# Patient Record
Sex: Male | Born: 1995 | Race: Black or African American | Hispanic: No | Marital: Single | Smoking: Never smoker
Health system: Southern US, Community
[De-identification: ages and names within clinical notes are randomized; demographics above are authoritative.]

## PROBLEM LIST (undated history)

## (undated) HISTORY — PX: ANKLE SURGERY: SHX546

---
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
## 2018-09-10 IMAGING — CR DG CHEST 2V
2 series · 2 of 2 positions shown · non-contrast
Comparison: None.

CLINICAL DATA: Shortness of breath

EXAM:
CHEST  2 VIEW

[chest pa]
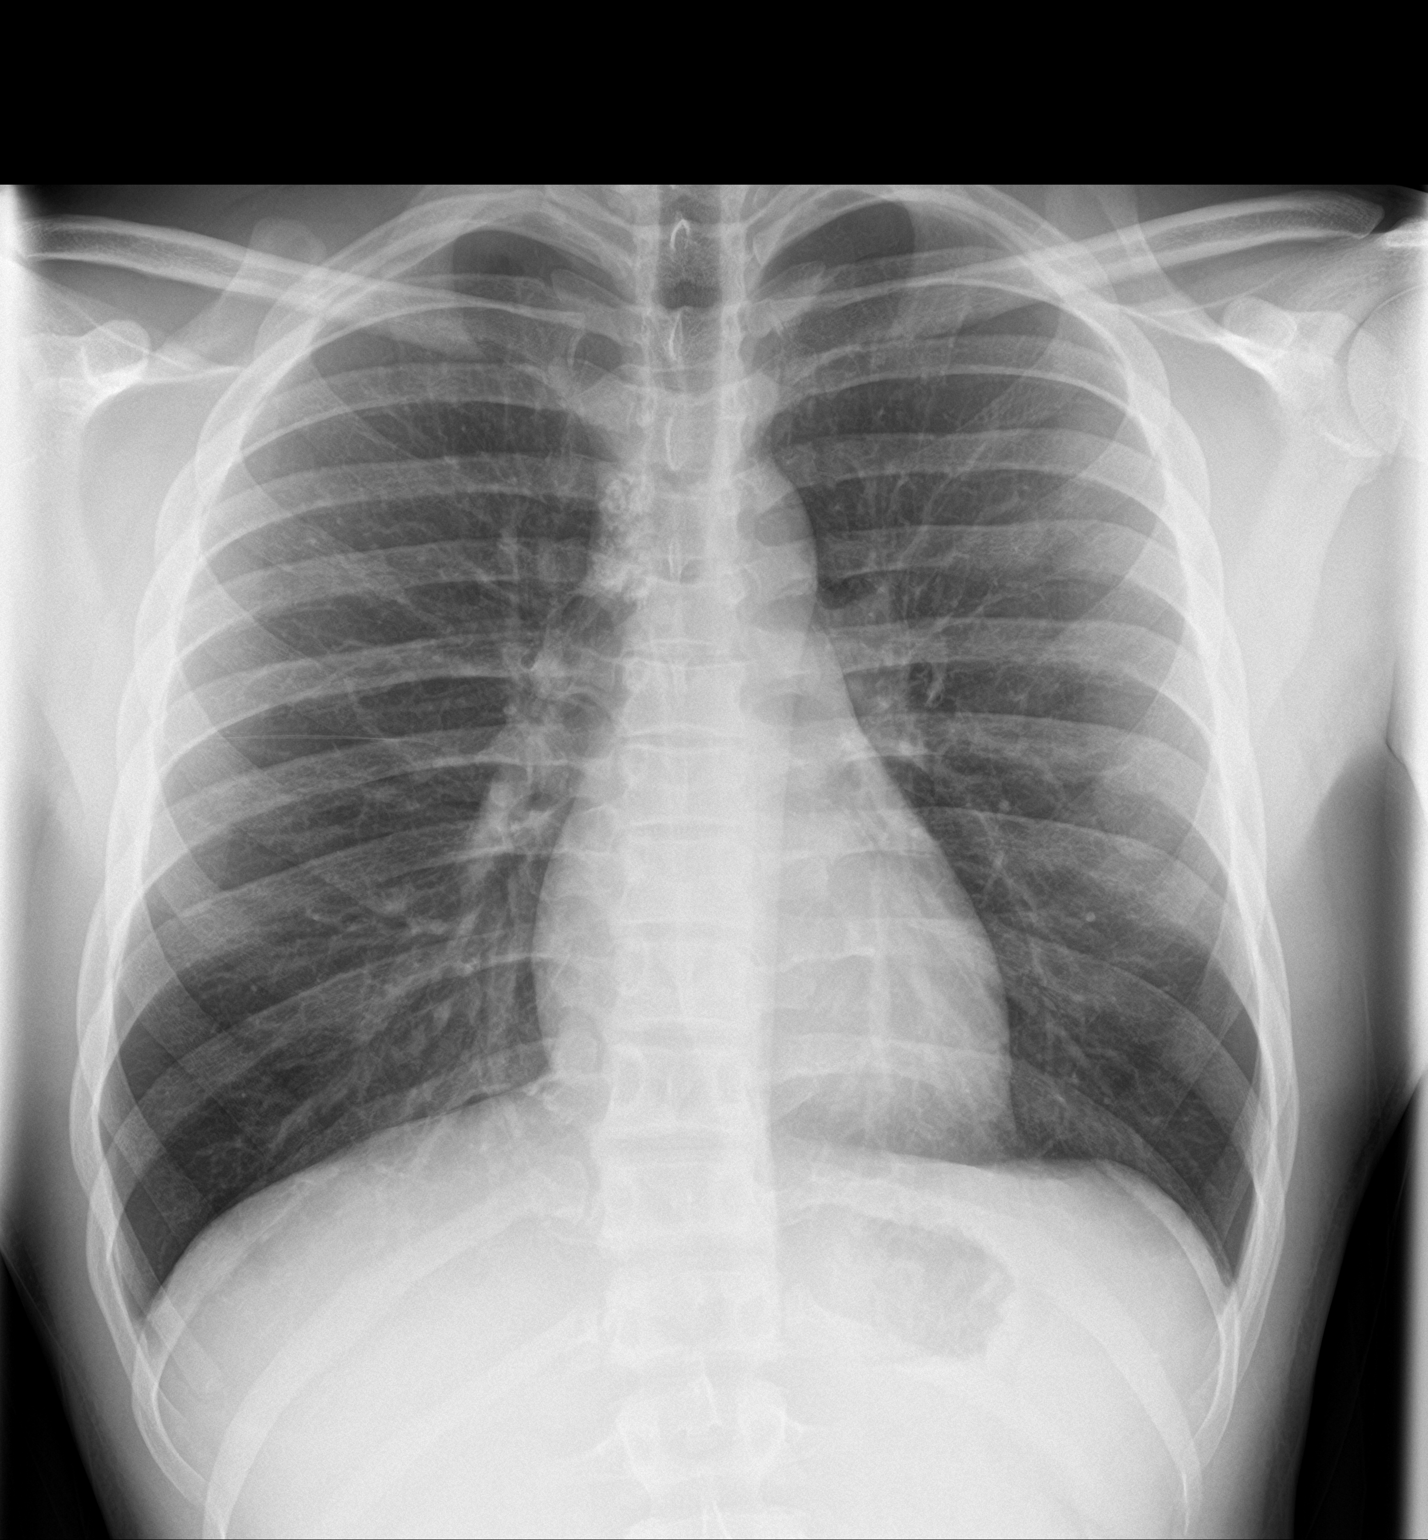

[chest lat]
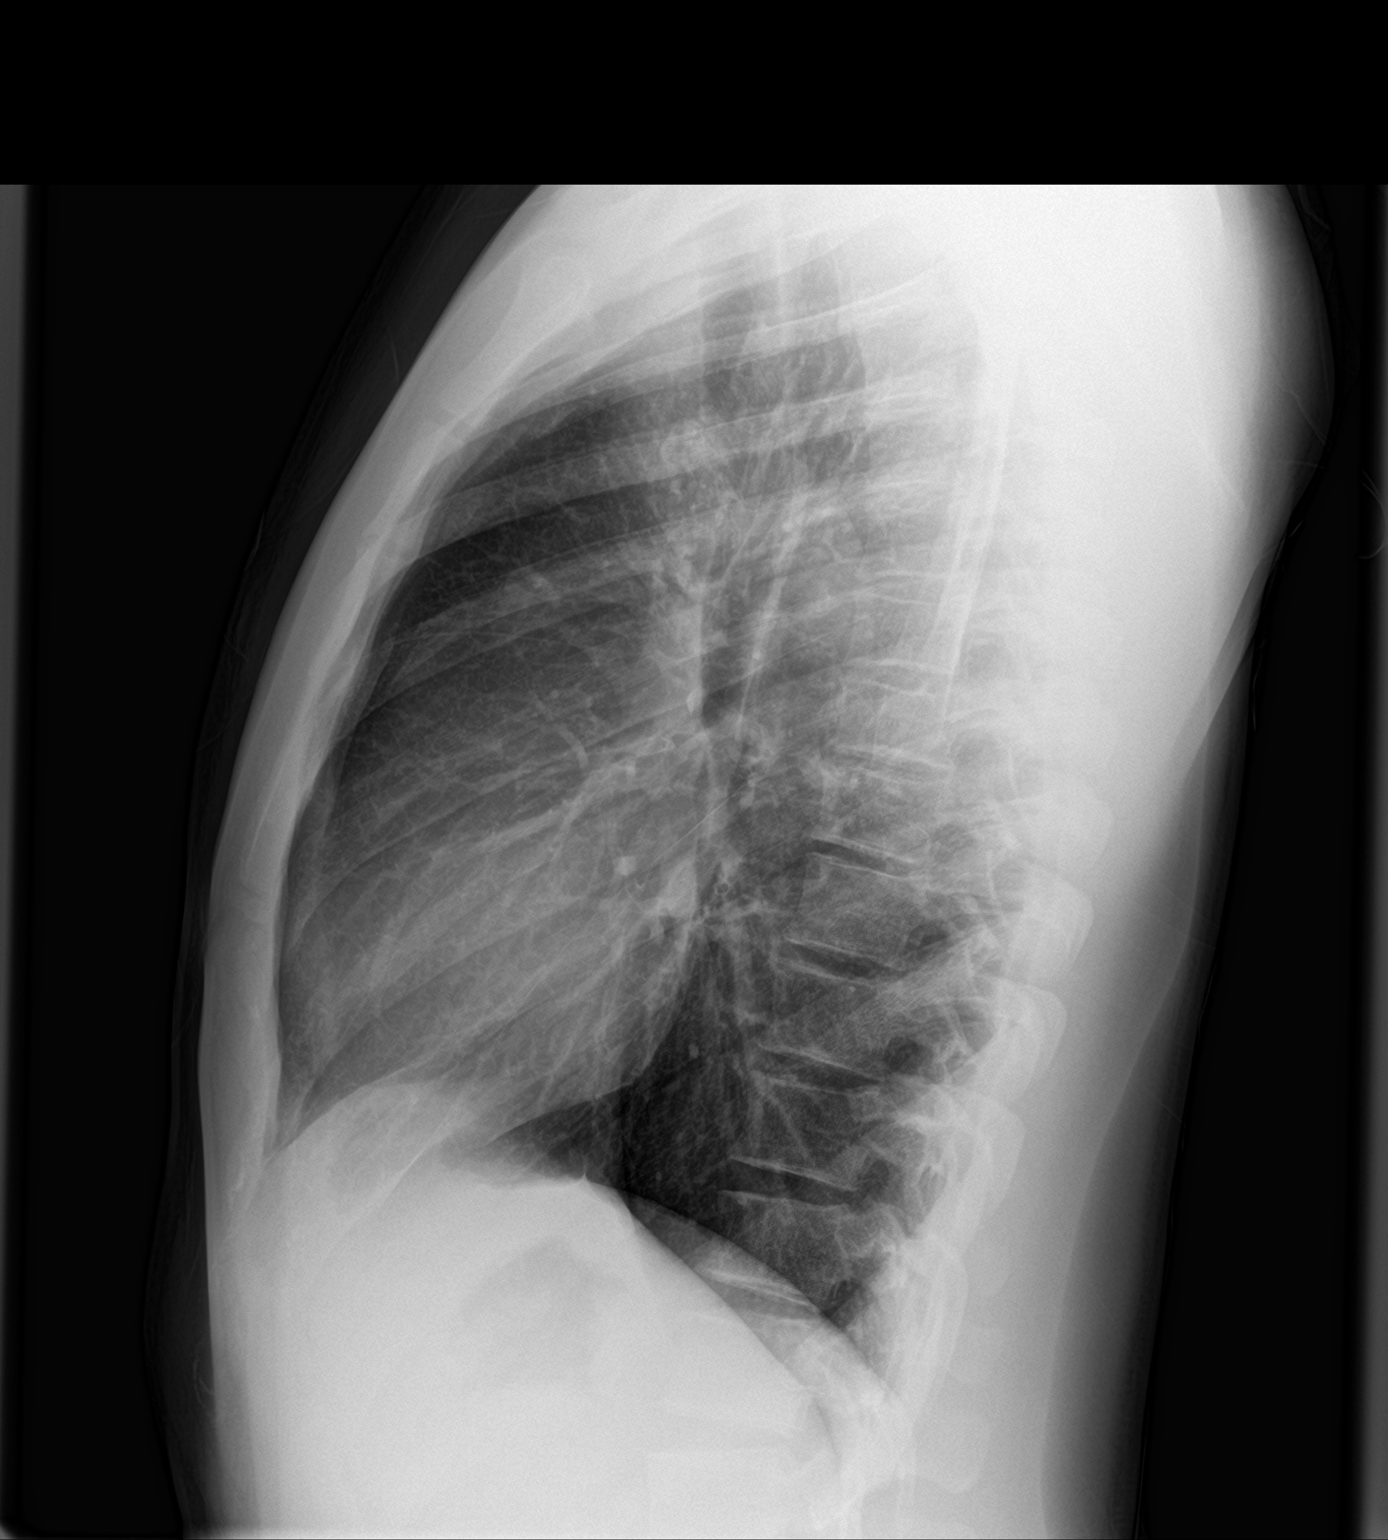

[2 of 2 positions shown; findings below may reference images not displayed]

FINDINGS: Calcified right paratracheal lymph node as seen with previous
granulomatous disease. There is no edema, consolidation, effusion,
or pneumothorax. Normal heart size and aortic contours.
IMPRESSION: 1. No evidence of active disease.
2. Granulomatous type mediastinal node calcification.
